# Patient Record
Sex: Male | Born: 1970 | Hispanic: No | Marital: Married | Smoking: Never smoker
Health system: Southern US, Community
[De-identification: ages and names within clinical notes are randomized; demographics above are authoritative.]

## PROBLEM LIST (undated history)

## (undated) DIAGNOSIS — I10 Essential (primary) hypertension: Secondary | ICD-10-CM

---

## 2008-08-09 ENCOUNTER — Ambulatory Visit: Payer: Self-pay | Admitting: Diagnostic Radiology

## 2008-08-09 ENCOUNTER — Emergency Department (HOSPITAL_BASED_OUTPATIENT_CLINIC_OR_DEPARTMENT_OTHER): Admission: EM | Admit: 2008-08-09 | Discharge: 2008-08-09 | Payer: Self-pay | Admitting: Emergency Medicine

## 2010-08-19 LAB — POCT TOXICOLOGY PANEL

## 2010-08-19 LAB — URINALYSIS, ROUTINE W REFLEX MICROSCOPIC
Bilirubin Urine: NEGATIVE
Glucose, UA: 100 mg/dL — AB
Hgb urine dipstick: NEGATIVE
Specific Gravity, Urine: 1.021 (ref 1.005–1.030)
pH: 6.5 (ref 5.0–8.0)

## 2010-08-19 LAB — COMPREHENSIVE METABOLIC PANEL
ALT: 53 U/L (ref 0–53)
BUN: 17 mg/dL (ref 6–23)
CO2: 23 mEq/L (ref 19–32)
Calcium: 8.4 mg/dL (ref 8.4–10.5)
GFR calc non Af Amer: >60 mL/min (ref >60)
Glucose, Bld: 170 mg/dL — ABNORMAL HIGH (ref 70–99)
Sodium: 146 mEq/L — ABNORMAL HIGH (ref 135–145)

## 2010-08-19 LAB — CBC
HCT: 47.3 % (ref 39.0–52.0)
Hemoglobin: 15.9 g/dL (ref 13.0–17.0)
MCHC: 33.6 g/dL (ref 30.0–36.0)
MCV: 84.9 fL (ref 78.0–100.0)
RBC: 5.57 MIL/uL (ref 4.22–5.81)
RDW: 12.4 % (ref 11.5–15.5)

## 2010-08-19 LAB — DIFFERENTIAL
Basophils Absolute: 0.1 10*3/uL (ref 0.0–0.1)
Basophils Relative: 1 % (ref 0–1)
Eosinophils Absolute: 0.2 10*3/uL (ref 0.0–0.7)
Eosinophils Relative: 2 % (ref 0–5)
Lymphs Abs: 4.9 10*3/uL — ABNORMAL HIGH (ref 0.7–4.0)
Monocytes Relative: 6 % (ref 3–12)
Neutro Abs: 5.2 10*3/uL (ref 1.7–7.7)
Neutrophils Relative %: 47 % (ref 43–77)

## 2010-08-19 LAB — URINE CULTURE
Colony Count: NO GROWTH
Culture: NO GROWTH

## 2010-08-19 LAB — MAGNESIUM: Magnesium: 1.9 mg/dL (ref 1.5–2.5)

## 2010-08-19 LAB — URINE MICROSCOPIC-ADD ON

## 2010-08-19 LAB — PROTIME-INR
INR: 1 (ref 0.00–1.49)
Prothrombin Time: 13.1 seconds (ref 11.6–15.2)

## 2010-08-19 LAB — AMMONIA: Ammonia: 23 umol/L (ref 11–35)

## 2010-08-19 LAB — ETHANOL: Alcohol, Ethyl (B): 262 mg/dL — ABNORMAL HIGH (ref 0–10)

## 2012-01-14 ENCOUNTER — Emergency Department (HOSPITAL_BASED_OUTPATIENT_CLINIC_OR_DEPARTMENT_OTHER)
Admission: EM | Admit: 2012-01-14 | Discharge: 2012-01-15 | Disposition: A | Discharge status: Home / Self Care | Payer: Self-pay | Attending: Emergency Medicine | Admitting: Emergency Medicine

## 2012-01-14 ENCOUNTER — Encounter (HOSPITAL_BASED_OUTPATIENT_CLINIC_OR_DEPARTMENT_OTHER): Payer: Self-pay | Admitting: *Deleted

## 2012-01-14 DIAGNOSIS — R739 Hyperglycemia, unspecified: Secondary | ICD-10-CM

## 2012-01-14 DIAGNOSIS — E119 Type 2 diabetes mellitus without complications: Secondary | ICD-10-CM | POA: Insufficient documentation

## 2012-01-14 DIAGNOSIS — I10 Essential (primary) hypertension: Secondary | ICD-10-CM | POA: Insufficient documentation

## 2012-01-14 HISTORY — DX: Essential (primary) hypertension: I10

## 2012-01-14 LAB — GLUCOSE, CAPILLARY: Glucose-Capillary: 216 mg/dL — ABNORMAL HIGH (ref 70–99)

## 2012-01-14 MED ORDER — SODIUM CHLORIDE 0.9 % IV BOLUS (SEPSIS)
1000.0000 mL | Freq: Once | INTRAVENOUS | Status: AC
  Administered 2012-01-15: 1000 mL via INTRAVENOUS

## 2012-01-14 NOTE — ED Provider Notes (Signed)
History     CSN: [id number]  Arrival date & time 01/14/12  2024   First MD Initiated Contact with Patient 01/14/12 2340      Chief Complaint  Patient presents with  . Hyperglycemia    (Consider location/radiation/quality/duration/timing/severity/associated sxs/prior treatment) HPI This is a 41 year old [place] male who has been out of his metformin for 3 days. He is here for a headache that started this afternoon. The onset was gradual. The pain is moderate. He also feels some general malaise. He has had increased urination and thirst. He also complains of moderate to severe burning in his feet which she has had for 5 years. He is also had some scotomata in his lateral right visual field for the last 2 months. He denies nausea, vomiting or diarrhea. He is currently in a rehabilitation program following a DUI. He is not on an antihypertensive.  Past Medical History  Diagnosis Date  . Hypertension   . Diabetes mellitus     History reviewed. No pertinent past surgical history.  History reviewed. No pertinent family history.  History  Substance Use Topics  . Smoking status: Never Smoker   . Smokeless tobacco: Not on file  . Alcohol Use: No      Review of Systems  All other systems reviewed and are negative.    Allergies  Tylenol  Home Medications   Current Outpatient Rx  Name Route Sig Dispense Refill  . METFORMIN HCL 500 MG PO TABS Oral Take 500 mg by mouth 2 (two) times daily with a meal.      BP 175/102  Pulse 83  Temp 99 F (37.2 C) (Oral)  Resp 16  Ht 5\' 9"  (1.753 m)  Wt 180 lb (81.647 kg)  BMI 26.58 kg/m2  SpO2 100%  Physical Exam General: Well-developed, well-nourished male in no acute distress; appearance consistent with age of record HENT: normocephalic, atraumatic Eyes: pupils equal round and reactive to light; extraocular muscles intact Neck: supple Heart: regular rate and rhythm Lungs: clear to auscultation bilaterally Abdomen: soft;  nondistended; nontender; bowel sounds present Extremities: No deformity; full range of motion; pulses normal; no edema Neurologic: Awake, alert and oriented; motor function intact in all extremities and symmetric; no facial droop Skin: Warm and dry Psychiatric: Normal mood and affect    ED Course  Procedures (including critical care time)     MDM   Nursing notes and vitals signs, including pulse oximetry, reviewed.  Summary of this visit's results, reviewed by myself:  Labs:  Results for orders placed during the hospital encounter of 01/14/12  GLUCOSE, CAPILLARY      Component Value Range   Glucose-Capillary 216 (*) 70 - 99 mg/dL  COMPREHENSIVE METABOLIC PANEL      Component Value Range   Sodium 137  135 - 145 mEq/L   Potassium 4.1  3.5 - 5.1 mEq/L   Chloride 100  96 - 112 mEq/L   CO2 28  19 - 32 mEq/L   Glucose, Bld 152 (*) 70 - 99 mg/dL   BUN 11  6 - 23 mg/dL   Creatinine, Ser 4.09  0.50 - 1.35 mg/dL   Calcium 9.2  8.4 - 81.1 mg/dL   Total Protein 6.9  6.0 - 8.3 g/dL   Albumin 3.5  3.5 - 5.2 g/dL   AST 21  0 - 37 U/L   ALT 23  0 - 53 U/L   Alkaline Phosphatase 83  39 - 117 U/L   Total Bilirubin 0.2 (*)  0.3 - 1.2 mg/dL   GFR calc non Af Amer >90  >90 mL/min   GFR calc Af Amer >90  >90 mL/min  CBC WITH DIFFERENTIAL      Component Value Range   WBC 8.5  4.0 - 10.5 K/uL   RBC 4.31  4.22 - 5.81 MIL/uL   Hemoglobin 13.6  13.0 - 17.0 g/dL   HCT 45.4  09.8 - 11.9 %   MCV 90.5  78.0 - 100.0 fL   MCH 31.6  26.0 - 34.0 pg   MCHC 34.9  30.0 - 36.0 g/dL   RDW 14.7  82.9 - 56.2 %   Platelets 322  150 - 400 K/uL   Neutrophils Relative 48  43 - 77 %   Neutro Abs 4.1  1.7 - 7.7 K/uL   Lymphocytes Relative 41  12 - 46 %   Lymphs Abs 3.5  0.7 - 4.0 K/uL   Monocytes Relative 9  3 - 12 %   Monocytes Absolute 0.8  0.1 - 1.0 K/uL   Eosinophils Relative 1  0 - 5 %   Eosinophils Absolute 0.1  0.0 - 0.7 K/uL   Basophils Relative 1  0 - 1 %   Basophils Absolute 0.0  0.0 - 0.1  K/uL           Hanley Seamen, MD 01/15/12 [phone]

## 2012-01-14 NOTE — ED Notes (Signed)
Pt c/o increased bs, pt is pt at [facility] rehab

## 2012-01-14 NOTE — ED Notes (Signed)
BS 216

## 2012-01-15 LAB — CBC WITH DIFFERENTIAL/PLATELET
Eosinophils Relative: 1 % (ref 0–5)
HCT: 39 % (ref 39.0–52.0)
Hemoglobin: 13.6 g/dL (ref 13.0–17.0)
Lymphocytes Relative: 41 % (ref 12–46)
Lymphs Abs: 3.5 10*3/uL (ref 0.7–4.0)
MCV: 90.5 fL (ref 78.0–100.0)
Monocytes Absolute: 0.8 10*3/uL (ref 0.1–1.0)
Monocytes Relative: 9 % (ref 3–12)
Platelets: 322 10*3/uL (ref 150–400)
RBC: 4.31 MIL/uL (ref 4.22–5.81)
WBC: 8.5 10*3/uL (ref 4.0–10.5)

## 2012-01-15 LAB — COMPREHENSIVE METABOLIC PANEL
Albumin: 3.5 g/dL (ref 3.5–5.2)
BUN: 11 mg/dL (ref 6–23)
Creatinine, Ser: 0.5 mg/dL (ref 0.50–1.35)
Total Protein: 6.9 g/dL (ref 6.0–8.3)

## 2012-01-15 MED ORDER — HYDROCHLOROTHIAZIDE 25 MG PO TABS: 25.0000 mg | ORAL_TABLET | Freq: Every day | ORAL | Status: AC

## 2012-01-15 MED ORDER — METFORMIN HCL 500 MG PO TABS
500.0000 mg | ORAL_TABLET | Freq: Once | ORAL | Status: AC
  Administered 2012-01-15: 500 mg via ORAL
  Filled 2012-01-15: qty 1

## 2012-01-15 MED ORDER — METFORMIN HCL 500 MG PO TABS: 500.0000 mg | ORAL_TABLET | Freq: Two times a day (BID) | ORAL | Status: AC

## 2019-09-06 ENCOUNTER — Inpatient Hospital Stay (HOSPITAL_COMMUNITY)
Admission: EM | Admit: 2019-09-06 | Discharge: 2019-10-09 | DRG: 082 | Disposition: E | Payer: Medicaid Other | Attending: Physician Assistant | Admitting: Physician Assistant

## 2019-09-06 ENCOUNTER — Emergency Department (HOSPITAL_COMMUNITY): Payer: Medicaid Other

## 2019-09-06 ENCOUNTER — Other Ambulatory Visit: Payer: Self-pay

## 2019-09-06 ENCOUNTER — Inpatient Hospital Stay (HOSPITAL_COMMUNITY): Payer: Medicaid Other

## 2019-09-06 DIAGNOSIS — E119 Type 2 diabetes mellitus without complications: Secondary | ICD-10-CM | POA: Diagnosis present

## 2019-09-06 DIAGNOSIS — R402112 Coma scale, eyes open, never, at arrival to emergency department: Secondary | ICD-10-CM | POA: Diagnosis present

## 2019-09-06 DIAGNOSIS — R7401 Elevation of levels of liver transaminase levels: Secondary | ICD-10-CM | POA: Diagnosis present

## 2019-09-06 DIAGNOSIS — Z515 Encounter for palliative care: Secondary | ICD-10-CM | POA: Diagnosis present

## 2019-09-06 DIAGNOSIS — S02632A Fracture of coronoid process of left mandible, initial encounter for closed fracture: Secondary | ICD-10-CM | POA: Diagnosis present

## 2019-09-06 DIAGNOSIS — R4020 Unspecified coma: Secondary | ICD-10-CM | POA: Diagnosis present

## 2019-09-06 DIAGNOSIS — S069X9A Unspecified intracranial injury with loss of consciousness of unspecified duration, initial encounter: Secondary | ICD-10-CM | POA: Diagnosis present

## 2019-09-06 DIAGNOSIS — Z7189 Other specified counseling: Secondary | ICD-10-CM

## 2019-09-06 DIAGNOSIS — G911 Obstructive hydrocephalus: Secondary | ICD-10-CM | POA: Diagnosis present

## 2019-09-06 DIAGNOSIS — R402312 Coma scale, best motor response, none, at arrival to emergency department: Secondary | ICD-10-CM | POA: Diagnosis present

## 2019-09-06 DIAGNOSIS — S0232XA Fracture of orbital floor, left side, initial encounter for closed fracture: Secondary | ICD-10-CM | POA: Diagnosis present

## 2019-09-06 DIAGNOSIS — M6282 Rhabdomyolysis: Secondary | ICD-10-CM | POA: Diagnosis present

## 2019-09-06 DIAGNOSIS — G935 Compression of brain: Secondary | ICD-10-CM | POA: Diagnosis present

## 2019-09-06 DIAGNOSIS — F1020 Alcohol dependence, uncomplicated: Secondary | ICD-10-CM | POA: Diagnosis present

## 2019-09-06 DIAGNOSIS — I10 Essential (primary) hypertension: Secondary | ICD-10-CM | POA: Diagnosis present

## 2019-09-06 DIAGNOSIS — G9382 Brain death: Secondary | ICD-10-CM | POA: Diagnosis present

## 2019-09-06 DIAGNOSIS — R4182 Altered mental status, unspecified: Secondary | ICD-10-CM

## 2019-09-06 DIAGNOSIS — R34 Anuria and oliguria: Secondary | ICD-10-CM | POA: Diagnosis not present

## 2019-09-06 DIAGNOSIS — J9601 Acute respiratory failure with hypoxia: Secondary | ICD-10-CM | POA: Diagnosis present

## 2019-09-06 DIAGNOSIS — E8809 Other disorders of plasma-protein metabolism, not elsewhere classified: Secondary | ICD-10-CM | POA: Diagnosis present

## 2019-09-06 DIAGNOSIS — S0240FA Zygomatic fracture, left side, initial encounter for closed fracture: Secondary | ICD-10-CM | POA: Diagnosis present

## 2019-09-06 DIAGNOSIS — Z20822 Contact with and (suspected) exposure to covid-19: Secondary | ICD-10-CM | POA: Diagnosis present

## 2019-09-06 DIAGNOSIS — S0240DA Maxillary fracture, left side, initial encounter for closed fracture: Secondary | ICD-10-CM | POA: Diagnosis present

## 2019-09-06 DIAGNOSIS — Z66 Do not resuscitate: Secondary | ICD-10-CM | POA: Diagnosis not present

## 2019-09-06 DIAGNOSIS — Z7984 Long term (current) use of oral hypoglycemic drugs: Secondary | ICD-10-CM

## 2019-09-06 DIAGNOSIS — X58XXXA Exposure to other specified factors, initial encounter: Secondary | ICD-10-CM | POA: Diagnosis present

## 2019-09-06 DIAGNOSIS — S022XXA Fracture of nasal bones, initial encounter for closed fracture: Secondary | ICD-10-CM | POA: Diagnosis present

## 2019-09-06 DIAGNOSIS — Z79899 Other long term (current) drug therapy: Secondary | ICD-10-CM

## 2019-09-06 DIAGNOSIS — K5900 Constipation, unspecified: Secondary | ICD-10-CM | POA: Diagnosis present

## 2019-09-06 DIAGNOSIS — R402212 Coma scale, best verbal response, none, at arrival to emergency department: Secondary | ICD-10-CM | POA: Diagnosis present

## 2019-09-06 DIAGNOSIS — Z590 Homelessness: Secondary | ICD-10-CM

## 2019-09-06 DIAGNOSIS — S069XAA Unspecified intracranial injury with loss of consciousness status unknown, initial encounter: Secondary | ICD-10-CM | POA: Diagnosis present

## 2019-09-06 LAB — MAGNESIUM: Magnesium: 1.3 mg/dL — ABNORMAL LOW (ref 1.7–2.4)

## 2019-09-06 LAB — COMPREHENSIVE METABOLIC PANEL
ALT: 65 U/L — ABNORMAL HIGH (ref 0–44)
AST: 161 U/L — ABNORMAL HIGH (ref 15–41)
Albumin: 3.1 g/dL — ABNORMAL LOW (ref 3.5–5.0)
Alkaline Phosphatase: 262 U/L — ABNORMAL HIGH (ref 38–126)
Anion gap: 16 — ABNORMAL HIGH (ref 5–15)
BUN: 8 mg/dL (ref 6–20)
CO2: 24 mmol/L (ref 22–32)
Calcium: 7.8 mg/dL — ABNORMAL LOW (ref 8.9–10.3)
Chloride: 101 mmol/L (ref 98–111)
Creatinine, Ser: 0.77 mg/dL (ref 0.61–1.24)
GFR calc Af Amer: >60 mL/min (ref >60)
GFR calc non Af Amer: >60 mL/min (ref >60)
Glucose, Bld: 164 mg/dL — ABNORMAL HIGH (ref 70–99)
Potassium: 3.6 mmol/L (ref 3.5–5.1)
Sodium: 141 mmol/L (ref 135–145)
Total Bilirubin: 1.3 mg/dL — ABNORMAL HIGH (ref 0.3–1.2)
Total Protein: 6.9 g/dL (ref 6.5–8.1)

## 2019-09-06 LAB — CBC WITH DIFFERENTIAL/PLATELET
Abs Immature Granulocytes: 0.04 10*3/uL (ref 0.00–0.07)
Basophils Absolute: 0 10*3/uL (ref 0.0–0.1)
Basophils Relative: 0 %
Eosinophils Absolute: 0 10*3/uL (ref 0.0–0.5)
Eosinophils Relative: 0 %
HCT: 46.2 % (ref 39.0–52.0)
Hemoglobin: 15.6 g/dL (ref 13.0–17.0)
Immature Granulocytes: 0 %
Lymphocytes Relative: 3 %
Lymphs Abs: 0.2 10*3/uL — ABNORMAL LOW (ref 0.7–4.0)
MCH: 31.2 pg (ref 26.0–34.0)
MCHC: 33.8 g/dL (ref 30.0–36.0)
MCV: 92.4 fL (ref 80.0–100.0)
Monocytes Absolute: 0.9 10*3/uL (ref 0.1–1.0)
Monocytes Relative: 9 %
Neutro Abs: 8.4 10*3/uL — ABNORMAL HIGH (ref 1.7–7.7)
Neutrophils Relative %: 88 %
Platelets: 159 10*3/uL (ref 150–400)
RBC: 5 MIL/uL (ref 4.22–5.81)
RDW: 14 % (ref 11.5–15.5)
WBC: 9.5 10*3/uL (ref 4.0–10.5)
nRBC: 0 % (ref 0.0–0.2)

## 2019-09-06 LAB — POCT I-STAT 7, (LYTES, BLD GAS, ICA,H+H)
Acid-Base Excess: 1 mmol/L (ref 0.0–2.0)
Bicarbonate: 26.5 mmol/L (ref 20.0–28.0)
Calcium, Ion: 1.06 mmol/L — ABNORMAL LOW (ref 1.15–1.40)
HCT: 42 % (ref 39.0–52.0)
Hemoglobin: 14.3 g/dL (ref 13.0–17.0)
O2 Saturation: 100 %
Patient temperature: 101.2
Potassium: 3.1 mmol/L — ABNORMAL LOW (ref 3.5–5.1)
Sodium: 141 mmol/L (ref 135–145)
TCO2: 28 mmol/L (ref 22–32)
pCO2 arterial: 45.3 mmHg (ref 32.0–48.0)
pH, Arterial: 7.382 (ref 7.350–7.450)
pO2, Arterial: 333 mmHg — ABNORMAL HIGH (ref 83.0–108.0)

## 2019-09-06 LAB — URINALYSIS, ROUTINE W REFLEX MICROSCOPIC
Bacteria, UA: NONE SEEN
Bilirubin Urine: NEGATIVE
Glucose, UA: 50 mg/dL — AB
Ketones, ur: 20 mg/dL — AB
Leukocytes,Ua: NEGATIVE
Nitrite: NEGATIVE
Protein, ur: >=300 mg/dL — AB
Specific Gravity, Urine: 1.017 (ref 1.005–1.030)
pH: 6 (ref 5.0–8.0)

## 2019-09-06 LAB — GRAM STAIN: Gram Stain: NONE SEEN

## 2019-09-06 LAB — PROTIME-INR
INR: 1.1 (ref 0.8–1.2)
Prothrombin Time: 13.3 seconds (ref 11.4–15.2)

## 2019-09-06 LAB — RESPIRATORY PANEL BY RT PCR (FLU A&B, COVID)
Influenza A by PCR: NEGATIVE
Influenza B by PCR: NEGATIVE
SARS Coronavirus 2 by RT PCR: NEGATIVE

## 2019-09-06 LAB — CK: Total CK: 2362 U/L — ABNORMAL HIGH (ref 49–397)

## 2019-09-06 LAB — SALICYLATE LEVEL: Salicylate Lvl: <7 mg/dL — ABNORMAL LOW (ref 7.0–30.0)

## 2019-09-06 LAB — PHOSPHORUS: Phosphorus: 4.4 mg/dL (ref 2.5–4.6)

## 2019-09-06 LAB — CBG MONITORING, ED: Glucose-Capillary: 156 mg/dL — ABNORMAL HIGH (ref 70–99)

## 2019-09-06 LAB — LACTIC ACID, PLASMA
Lactic Acid, Venous: 3.2 mmol/L (ref 0.5–1.9)
Lactic Acid, Venous: 1.9 mmol/L (ref 0.5–1.9)

## 2019-09-06 LAB — ETHANOL: Alcohol, Ethyl (B): 27 mg/dL — ABNORMAL HIGH

## 2019-09-06 LAB — ACETAMINOPHEN LEVEL: Acetaminophen (Tylenol), Serum: <10 ug/mL — ABNORMAL LOW (ref 10–30)

## 2019-09-06 MED ORDER — PANTOPRAZOLE SODIUM 40 MG IV SOLR
40.0000 mg | Freq: Every day | INTRAVENOUS | Status: DC
  Administered 2019-09-06 – 2019-09-08 (×3): 40 mg via INTRAVENOUS
  Filled 2019-09-06 (×3): qty 40

## 2019-09-06 MED ORDER — PROPOFOL 1000 MG/100ML IV EMUL
5.0000 ug/kg/min | INTRAVENOUS | Status: DC
  Administered 2019-09-06: 15 ug/kg/min via INTRAVENOUS
  Filled 2019-09-06: qty 100

## 2019-09-06 MED ORDER — ALBUTEROL SULFATE (2.5 MG/3ML) 0.083% IN NEBU
10.0000 mg | INHALATION_SOLUTION | Freq: Once | RESPIRATORY_TRACT | Status: AC
  Administered 2019-09-06: 10 mg via RESPIRATORY_TRACT
  Filled 2019-09-06: qty 12

## 2019-09-06 MED ORDER — METOPROLOL TARTRATE 5 MG/5ML IV SOLN
5.0000 mg | Freq: Four times a day (QID) | INTRAVENOUS | Status: DC | PRN
  Administered 2019-09-07 (×2): 5 mg via INTRAVENOUS
  Filled 2019-09-06 (×2): qty 5

## 2019-09-06 MED ORDER — ACETAMINOPHEN 160 MG/5ML PO SOLN
650.0000 mg | Freq: Four times a day (QID) | ORAL | Status: DC | PRN
  Administered 2019-09-07 – 2019-09-08 (×4): 650 mg
  Filled 2019-09-06 (×4): qty 20.3

## 2019-09-06 MED ORDER — CALCIUM GLUCONATE-NACL 1-0.675 GM/50ML-% IV SOLN
1.0000 g | Freq: Once | INTRAVENOUS | Status: AC
  Administered 2019-09-06: 1000 mg via INTRAVENOUS
  Filled 2019-09-06: qty 50

## 2019-09-06 MED ORDER — LEVETIRACETAM IN NACL 500 MG/100ML IV SOLN
500.0000 mg | Freq: Once | INTRAVENOUS | Status: AC
  Administered 2019-09-06: 18:00:00 500 mg via INTRAVENOUS
  Filled 2019-09-06: qty 100

## 2019-09-06 MED ORDER — MAGNESIUM SULFATE 2 GM/50ML IV SOLN
2.0000 g | Freq: Once | INTRAVENOUS | Status: AC
  Administered 2019-09-06: 2 g via INTRAVENOUS
  Filled 2019-09-06: qty 50

## 2019-09-06 MED ORDER — FENTANYL CITRATE (PF) 100 MCG/2ML IJ SOLN
50.0000 ug | INTRAMUSCULAR | Status: DC | PRN
  Administered 2019-09-07 – 2019-09-08 (×5): 100 ug via INTRAVENOUS
  Filled 2019-09-06 (×5): qty 2

## 2019-09-06 MED ORDER — DOCUSATE SODIUM 50 MG/5ML PO LIQD
100.0000 mg | Freq: Two times a day (BID) | ORAL | Status: DC
  Filled 2019-09-06: qty 10

## 2019-09-06 MED ORDER — INSULIN ASPART 100 UNIT/ML ~~LOC~~ SOLN
0.0000 [IU] | SUBCUTANEOUS | Status: DC
  Administered 2019-09-06 – 2019-09-07 (×2): 3 [IU] via SUBCUTANEOUS
  Administered 2019-09-07 – 2019-09-08 (×4): 2 [IU] via SUBCUTANEOUS

## 2019-09-06 MED ORDER — CALCIUM GLUCONATE 10 % IV SOLN
1.0000 g | Freq: Once | INTRAVENOUS | Status: DC
  Filled 2019-09-06: qty 10

## 2019-09-06 MED ORDER — INSULIN ASPART 100 UNIT/ML IV SOLN
10.0000 [IU] | Freq: Once | INTRAVENOUS | Status: AC
  Administered 2019-09-06: 10 [IU] via INTRAVENOUS

## 2019-09-06 MED ORDER — LIDOCAINE HCL (PF) 1 % IJ SOLN
70.0000 mg | Freq: Once | INTRAMUSCULAR | Status: AC
  Administered 2019-09-06: 70 mg
  Filled 2019-09-06: qty 10

## 2019-09-06 MED ORDER — SUCCINYLCHOLINE CHLORIDE 20 MG/ML IJ SOLN
100.0000 mg | Freq: Once | INTRAMUSCULAR | Status: AC
  Administered 2019-09-06: 100 mg via INTRAVENOUS
  Filled 2019-09-06: qty 5

## 2019-09-06 MED ORDER — ACETAMINOPHEN 500 MG PO TABS: 1000.0000 mg | ORAL_TABLET | Freq: Once | ORAL | Status: DC

## 2019-09-06 MED ORDER — ACETAMINOPHEN 500 MG PO TABS
1000.0000 mg | ORAL_TABLET | Freq: Once | ORAL | Status: AC
  Administered 2019-09-06: 1000 mg
  Filled 2019-09-06: qty 2

## 2019-09-06 MED ORDER — LACTATED RINGERS IV BOLUS
1000.0000 mL | Freq: Once | INTRAVENOUS | Status: AC
  Administered 2019-09-06: 1000 mL via INTRAVENOUS

## 2019-09-06 MED ORDER — POLYETHYLENE GLYCOL 3350 17 G PO PACK: 17.0000 g | PACK | Freq: Every day | ORAL | Status: DC

## 2019-09-06 MED ORDER — FENTANYL CITRATE (PF) 100 MCG/2ML IJ SOLN
INTRAMUSCULAR | Status: AC
  Administered 2019-09-06: 100 ug via INTRAVENOUS
  Filled 2019-09-06: qty 2

## 2019-09-06 MED ORDER — PROPOFOL 1000 MG/100ML IV EMUL
5.0000 ug/kg/min | INTRAVENOUS | Status: DC
  Administered 2019-09-06: 5 ug/kg/min via INTRAVENOUS

## 2019-09-06 MED ORDER — DEXTROSE 50 % IV SOLN
1.0000 | Freq: Once | INTRAVENOUS | Status: AC
  Administered 2019-09-06: 18:00:00 50 mL via INTRAVENOUS
  Filled 2019-09-06: qty 50

## 2019-09-06 MED ORDER — PROPOFOL 1000 MG/100ML IV EMUL
0.0000 ug/kg/min | INTRAVENOUS | Status: DC
  Administered 2019-09-06: 23:00:00 15 ug/kg/min via INTRAVENOUS

## 2019-09-06 MED ORDER — ETOMIDATE 2 MG/ML IV SOLN
20.0000 mg | Freq: Once | INTRAVENOUS | Status: AC
  Administered 2019-09-06: 17:00:00 20 mg via INTRAVENOUS
  Filled 2019-09-06: qty 10

## 2019-09-06 MED ORDER — ROCURONIUM BROMIDE 50 MG/5ML IV SOLN: 60.0000 mg | Freq: Once | INTRAVENOUS | Status: DC

## 2019-09-06 MED ORDER — PROPOFOL 1000 MG/100ML IV EMUL
INTRAVENOUS | Status: AC
  Administered 2019-09-06: 15 ug/kg/min via INTRAVENOUS
  Filled 2019-09-06: qty 100

## 2019-09-06 MED ORDER — NIMODIPINE 30 MG PO CAPS: 60.0000 mg | ORAL_CAPSULE | ORAL | Status: DC

## 2019-09-06 MED ORDER — FENTANYL CITRATE (PF) 100 MCG/2ML IJ SOLN: 50.0000 ug | INTRAMUSCULAR | Status: DC | PRN

## 2019-09-06 MED ORDER — FENTANYL CITRATE (PF) 100 MCG/2ML IJ SOLN
140.0000 ug | Freq: Once | INTRAMUSCULAR | Status: AC
  Administered 2019-09-06: 17:00:00 140 ug via INTRAVENOUS
  Filled 2019-09-06: qty 4

## 2019-09-06 MED ORDER — LABETALOL HCL 5 MG/ML IV SOLN
10.0000 mg | Freq: Once | INTRAVENOUS | Status: AC
  Administered 2019-09-06: 16:00:00 10 mg via INTRAVENOUS
  Filled 2019-09-06: qty 4

## 2019-09-06 MED ORDER — POTASSIUM CHLORIDE IN NACL 20-0.9 MEQ/L-% IV SOLN
INTRAVENOUS | Status: DC
  Filled 2019-09-06 (×3): qty 1000

## 2019-09-06 MED ORDER — PANTOPRAZOLE SODIUM 40 MG PO TBEC: 40.0000 mg | DELAYED_RELEASE_TABLET | Freq: Every day | ORAL | Status: DC

## 2019-09-06 MED ORDER — FENTANYL CITRATE (PF) 100 MCG/2ML IJ SOLN
100.0000 ug | Freq: Once | INTRAMUSCULAR | Status: AC
  Administered 2019-09-06: 17:00:00 100 ug via INTRAVENOUS
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
## 2019-09-06 NOTE — ED Notes (Signed)
Head CT done.

## 2019-09-06 NOTE — ED Provider Notes (Signed)
Kirk Schmidt Note   CSN: [id number] Arrival date & time: 08/11/2019  1530     History Chief Complaint  Patient presents with  . Altered Mental Status    Kirk Schmidt is a 49 y.o. male.  The history is provided by the patient and the EMS personnel. The history is limited by the condition of the patient.      49 year old mlae with a pmh of alcohol abuse, DM, HTN, homelessness, presenting to the ED brought in EMS after being found face down in his tent unresponsive with dried blood to the bilateral nares and mouth. He was noted to have unequal pupils. Unknown down time. Normal sugar pta. History limited 2/2 patient's condition.   Past Medical History:  Diagnosis Date  . Diabetes mellitus   . Hypertension     Patient Active Problem List   Diagnosis Date Noted  . TBI (traumatic brain injury) ([place]) 09/01/2019    No past surgical history on file.     No family history on file.  Social History   Tobacco Use  . Smoking status: Never Smoker  Substance Use Topics  . Alcohol use: No  . Drug use: No    Home Medications Prior to Admission medications   Medication Sig Start Date End Date Taking? Authorizing Schmidt  hydrochlorothiazide (HYDRODIURIL) 25 MG tablet Take 1 tablet (25 mg total) by mouth daily. 01/15/12 01/14/13  Molpus, Jenny Reichmann, MD  metFORMIN (GLUCOPHAGE) 500 MG tablet Take 1 tablet (500 mg total) by mouth 2 (two) times daily with a meal. 01/15/12   Molpus, John, MD    Allergies    Tylenol [acetaminophen]  Review of Systems   Review of Systems  Unable to perform ROS: Acuity of condition    Physical Exam Updated Vital Signs BP (!) 143/80   Pulse 85   Temp (!) 101.3 F (38.5 C)   Resp (!) 21   Ht _0  (1.651 m)   Wt 70.1 kg   SpO2 99%   BMI 25.72 kg/m   Physical Exam Constitutional:      General: He is in acute distress.     Appearance: He is ill-appearing.  Eyes:     Comments: 5 mm L, 3 mm R  unreactive  Neurological:     Mental Status: He is unresponsive.     GCS: GCS eye subscore is 1. GCS verbal subscore is 1. GCS motor subscore is 1.     ED Results / Procedures / Treatments   Labs (all labs ordered are listed, but only abnormal results are displayed) Labs Reviewed  CBC WITH DIFFERENTIAL/PLATELET - Abnormal; Notable for the following components:      Result Value   Neutro Abs 8.4 (*)    Lymphs Abs 0.2 (*)    All other components within normal limits  ETHANOL - Abnormal; Notable for the following components:   Alcohol, Ethyl (B) 27 (*)    All other components within normal limits  ACETAMINOPHEN LEVEL - Abnormal; Notable for the following components:   Acetaminophen (Tylenol), Serum <10 (*)    All other components within normal limits  SALICYLATE LEVEL - Abnormal; Notable for the following components:   Salicylate Lvl <6.8 (*)    All other components within normal limits  URINALYSIS, ROUTINE W REFLEX MICROSCOPIC - Abnormal; Notable for the following components:   Color, Urine AMBER (*)    Glucose, UA 50 (*)    Hgb urine dipstick LARGE (*)  Ketones, ur 20 (*)    Protein, ur >=300 (*)    All other components within normal limits  LACTIC ACID, PLASMA - Abnormal; Notable for the following components:   Lactic Acid, Venous 3.2 (*)    All other components within normal limits  COMPREHENSIVE METABOLIC PANEL - Abnormal; Notable for the following components:   Glucose, Bld 164 (*)    Calcium 7.8 (*)    Albumin 3.1 (*)    AST 161 (*)    ALT 65 (*)    Alkaline Phosphatase 262 (*)    Total Bilirubin 1.3 (*)    Anion gap 16 (*)    All other components within normal limits  MAGNESIUM - Abnormal; Notable for the following components:   Magnesium 1.3 (*)    All other components within normal limits  CK - Abnormal; Notable for the following components:   Total CK 2,362 (*)    All other components within normal limits  POCT I-STAT EG7 - Abnormal; Notable for the  following components:   pH, Ven 7.438 (*)    pCO2, Ven 37.3 (*)    pO2, Ven 63.0 (*)    Sodium 134 (*)    Potassium >8.5 (*)    Calcium, Ion 0.82 (*)    All other components within normal limits  POCT I-STAT 7, (LYTES, BLD GAS, ICA,H+H) - Abnormal; Notable for the following components:   pO2, Arterial 333 (*)    Potassium 3.1 (*)    Calcium, Ion 1.06 (*)    All other components within normal limits  GRAM STAIN  RESPIRATORY PANEL BY RT PCR (FLU A&B, COVID)  CULTURE, BLOOD (ROUTINE X 2)  CULTURE, BLOOD (ROUTINE X 2)  URINE CULTURE  LACTIC ACID, PLASMA  PROTIME-INR  PHOSPHORUS  BLOOD GAS, ARTERIAL  TRIGLYCERIDES  I-STAT VENOUS BLOOD GAS, ED  I-STAT CHEM 8, ED    EKG EKG Interpretation  Date/Time:  Thursday September 06 2019 15:47:52 EDT Ventricular Rate:  118 PR Interval:    QRS Duration: 98 QT Interval:  327 QTC Calculation: 459 R Axis:   73 Text Interpretation: Sinus tachycardia Biatrial enlargement Left ventricular hypertrophy When compared with ECG of 08/09/2008, No significant change was found Confirmed by Delora Fuel ([id number]) on 08/29/2019 5:50:49 PM   Radiology CT HEAD WO CONTRAST  Result Date: 08/28/2019 CLINICAL DATA:  Cerebral hemorrhage suspected. Additional provided: Patient found down face first in 10 unresponsive. EXAM: CT HEAD WITHOUT CONTRAST TECHNIQUE: Contiguous axial images were obtained from the base of the skull through the vertex without intravenous contrast. COMPARISON:  Noncontrast head CT 03/01/2019 FINDINGS: Brain: There is a large mixed density holohemispheric hyperacute/acute right subdural hematoma measuring 2.2 cm in thickness. There is also thin acute subdural hemorrhage along the falx, and right tentorium. Severe mass effect upon the underlying right cerebral hemisphere. Complete effacement of the lateral and third ventricles. 2 cm leftward midline shift. There is right-sided uncal herniation as well as leftward subfalcine herniation. Mass effect upon  the brainstem. There is a 3 mm focus of head within the left aspect of the pons which may reflect a Duret hemorrhage. 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 There is an additional acute left-sided subdural hemorrhage overlying the left frontoparietal lobes measuring up to 5 mm. There is also scattered small volume acute subarachnoid hemorrhage most notably within the interhemispheric fissure. Vascular: No definite hyperdense vessel. Skull: There is a nondisplaced fracture within the left orbital roof (series 4, image 46). There is  also comminuted displaced fracture lateral wall of the left orbit. There is a mildly displaced fracture within the anterior wall of the left maxillary sinus which extends to involve the left orbital floor. There is additional comminuted and displaced fracturing of the anterior and posterolateral walls of the left maxillary sinus. Redemonstrated comminuted bilateral nasal bone fractures. Mildly displaced fracture of the coronoid process of the left mandible (series 5, image 30). Comminuted and displaced fracture of the left zygomatic arch. Sinuses/Orbits: No definite retrobulbar hematoma. The globes are normal in size and contour. Hyperdense opacification of the left maxillary sinus likely reflecting blood. Left frontal and bilateral ethmoid sinus mucosal thickening. There is a left mastoid effusion. No definite left temporal bone fracture is identified. These results were called by telephone at the time of interpretation on 08/10/2019 at 5:15 pm to [facility] , who verbally acknowledged these results. IMPRESSION: 1. Large mixed density holohemispheric hyperacute/acute right subdural hematoma measuring 2 cm in thickness. Thin acute subdural blood products are also present along the falx and right tentorium. 2. Severe mass effect upon the underlying right cerebral hemisphere. 2 cm leftward midline shift with complete effacement of the  right lateral and third ventricles. Right uncal herniation with mass effect upon the brainstem as well as leftward subfalcine herniation. 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 A 3 mm parenchymal hemorrhage within the upper pons may reflect a Duret hemorrage. 3. Acute subdural hematoma overlying the left frontoparietal lobes measuring 5 mm in greatest thickness. 4. Scattered acute subarachnoid hemorrhage greatest within the interhemispheric fissure. 5. Multiple maxillofacial and left orbital fractures as detailed. Of note, one of the left orbital fractures traverses the left orbital roof. Maxillofacial CT is recommended for further evaluation when clinically feasible. 6. Mildly displaced fracture of the coronoid process of the left mandible. 7. Hyperdense opacification of the left maxillary sinus likely reflecting blood. 8. Left mastoid effusion without definite left temporal bone fracture identified. Electronically Signed   By: Kellie Simmering DO   On: 08/31/2019 17:21   DG Chest Portable 1 View  Result Date: 09/05/2019 CLINICAL DATA:  Tube placement. EXAM: PORTABLE CHEST 1 VIEW COMPARISON:  Chest radiograph earlier this day. FINDINGS: Enteric tube is in place with tip below the diaphragm, not included in the field of view. An enteric tube is tentatively visualized with tip projecting just below the clavicular heads, however this is not well assessed due to overlapping monitoring devices and rotation. Heart is normal in size. Aortic atherosclerosis. No pneumothorax or confluent airspace disease. IMPRESSION: 1. Enteric tube in place with tip below the diaphragm not included in the field of view. 2. Possible endotracheal tube with tip projecting below the clavicular heads, however this is not well assessed given overlapping structures and patient rotation. Recommend follow-up exam limiting patient rotation. Electronically Signed   By: Keith Rake M.D.   On: 09/01/2019 18:28    DG Chest Portable 1 View  Result Date: 08/11/2019 CLINICAL DATA:  Altered mental status. EXAM: PORTABLE CHEST 1 VIEW COMPARISON:  November 23, 2018. FINDINGS: The heart size and mediastinal contours are within normal limits. Both lungs are clear. No pneumothorax or pleural effusion is noted. The visualized skeletal structures are unremarkable. IMPRESSION: No active disease. Electronically Signed   By: Marijo Conception M.D.   On: 08/23/2019 16:52    Procedures Procedure Name: Intubation Date/Time: 09/05/2019 6:06 PM Performed by: Filbert Berthold, MD Pre-anesthesia Checklist: Patient identified, Emergency Drugs available, Suction available, Patient being monitored and Timeout  performed Oxygen Delivery Method: Non-rebreather mask Preoxygenation: Pre-oxygenation with 100% oxygen Induction Type: Rapid sequence Laryngoscope Size: Mac and 3 Grade View: Grade I Tube size: 7.5 mm Number of attempts: 1 Placement Confirmation: ETT inserted through vocal cords under direct vision,  Positive ETCO2,  CO2 detector and Breath sounds checked- equal and bilateral Secured at: 24 cm Tube secured with: ETT holder      (including critical care time)  Medications Ordered in ED Medications  acetaminophen (TYLENOL) tablet 1,000 mg (has no administration in time range)  propofol (DIPRIVAN) 1000 MG/100ML infusion (15 mcg/kg/min  70.1 kg Intravenous New Bag/Given 08/29/2019 1921)  acetaminophen (TYLENOL) tablet 1,000 mg (1,000 mg Oral Not Given 08/24/2019 1847)  magnesium sulfate IVPB 2 g 50 mL (has no administration in time range)  lactated ringers bolus 1,000 mL (has no administration in time range)  labetalol (NORMODYNE) injection 10 mg (10 mg Intravenous Given 08/24/2019 1610)  levETIRAcetam (KEPPRA) IVPB 500 mg/100 mL premix (0 mg Intravenous Stopped 08/14/2019 1805)  lidocaine (PF) (XYLOCAINE) 1 % injection 70 mg (70 mg Other Given 09/07/2019 1646)  fentaNYL (SUBLIMAZE) injection 140 mcg (140 mcg Intravenous Given  08/28/2019 1646)  etomidate (AMIDATE) injection 20 mg (20 mg Intravenous Given 08/17/2019 1656)  succinylcholine (ANECTINE) injection 100 mg (100 mg Intravenous Given 08/26/2019 1657)  albuterol (PROVENTIL) (2.5 MG/3ML) 0.083% nebulizer solution 10 mg (10 mg Nebulization Given 09/02/2019 1722)  insulin aspart (novoLOG) injection 10 Units (10 Units Intravenous Given 08/30/2019 1800)    And  dextrose 50 % solution 50 mL (50 mLs Intravenous Given 08/18/2019 1800)  calcium gluconate 1 g/ 50 mL sodium chloride IVPB (1,000 mg Intravenous New Bag/Given 08/27/2019 1805)  fentaNYL (SUBLIMAZE) injection 100 mcg (100 mcg Intravenous Given 08/09/2019 1727)    ED Course  I have reviewed the triage vital signs and the nursing notes.  Pertinent labs & imaging results that were available during my care of the patient were reviewed by me and considered in my medical decision making (see chart for details).    MDM Rules/Calculators/A&P                      49 year old mlae with a pmh of alcohol abuse, DM, HTN, homelessness, presenting to the ED brought in EMS after being found face down in his tent unresponsive with dried blood to the bilateral nares and mouth.  Initial concerns most for spontaneous versus traumatic intracranial hemorrhage given the patient's initial hypertension treated acutely with labetalol.  Patient separately transported emergently to the CT scanner for imaging which demonstrated an acute large subdural hemorrhage with midline shift.  Patient was subsequently intubated for airway protection given low GCS.  Patient was premedicated with lidocaine and fentanyl and subsequently induced with etomidate and succinylcholine.  Intubation was performed as outlined above.  CT head demonstrated: "1. Large mixed density holohemispheric hyperacute/acute right subdural hematoma measuring 2 cm in thickness. Thin acute subdural blood products are also present along the falx and right tentorium. 2. Severe mass effect upon the  underlying right cerebral hemisphere. 2 cm leftward midline shift with complete effacement of the right lateral and third ventricles. Right uncal herniation with mass effect upon the brainstem as well as leftward subfalcine herniation. 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 A 3 mm parenchymal hemorrhage within the upper pons may reflect a Duret hemorrage. 3. Acute subdural hematoma overlying the left frontoparietal lobes measuring 5 mm in greatest thickness. 4. Scattered acute subarachnoid hemorrhage greatest within the  interhemispheric fissure. 5. Multiple maxillofacial and left orbital fractures as detailed. Of note, one of the left orbital fractures traverses the left orbital roof. Maxillofacial CT is recommended for further evaluation when clinically feasible. 6. Mildly displaced fracture of the coronoid process of the left mandible. 7. Hyperdense opacification of the left maxillary sinus likely reflecting blood. 8. Left mastoid effusion without definite left temporal bone fracture identified."  Emergent consult placed to neurosurgery at 1623 for consideration of operative management and further recommendations in the setting of the severe subdural hematoma with midline shift and mass-effect as well as subfalcine herniation.  ECG interpreted by me demonstrated Sinus tachycardia 180 bpm, normal axis, QTC 459 otherwise normal intervals, biatrial enlargement, LVH, hyperacute T waves in anterior lateral leads, overall similar compared to previous on 08/09/2008  CXR interpreted by me demonstrated no acute cardiopulmonary processes  Chest x-ray post intubation demonstrated ETT in appropriate position, OG tube in appropriate position  Labs demonstrated initial lactic acid 3.2, unremarkable CBC, i-STAT VBG demonstrated a pH of 7.438/37.3/63.0 with mild hyponatremia 134, hyperkalemia to greater than 8.5 and hypocalcemia to 0.82, peaked T waves noted on the  patient's EKG and will temporized with calcium, albuterol and insulin, serum ethanol level 27, negative acetaminophen and salicylate level, Covid assay negative, urinalysis with greater than 300 protein and 20 ketones, repeat lactic acid improved to 1.9, CMP noted to have hypocalcemia to 7.8, hypoalbuminemia 3.1, transaminitis with AST of 161, ALT 65 and alk phos of 262 with a T bili elevation of 1.3 and an anion gap of 16, hypomagnesemia to 1.3 will replete IV, phosphorus 4.4.  CK noted to be 2362.  Will treat with additional IV fluids in setting of possible rhabdomyolysis.  Following neurosurgery's bedside evaluation they felt this is a nonsurvivable injury and had no surgical intervention to offer.  We will admit the patient to trauma surgery for ongoing evaluation monitoring and management as well as coordination care.  The plan for this patient was discussed with Dr. Roxanne Mins, who voiced agreement and who oversaw evaluation and treatment of this patient.  Final Clinical Impression(s) / ED Diagnoses Final diagnoses:  Altered mental status, unspecified altered mental status type    Rx / DC Orders ED Discharge Orders    None      Filbert Berthold, MD 16/07/37 [id number]  Delora Fuel, MD 69/48/54 [id number]

## 2019-09-06 NOTE — ED Triage Notes (Signed)
EMS received a call  From un known person that Pt was found down face first in his tent unresponsive . Pt arrived Via EMS with 100% non rebreather mask on with SPO2 98%. Pt does not respond to verbal name calling. EMS vitals CBG 191 , Temp 103 PO and BP 192/104. Unknown HX and unknown Meds.

## 2019-09-06 NOTE — Progress Notes (Signed)
PT transported to CT and back with no complications 

## 2019-09-07 LAB — CBC
HCT: 41.9 % (ref 39.0–52.0)
Hemoglobin: 14.1 g/dL (ref 13.0–17.0)
MCH: 30.6 pg (ref 26.0–34.0)
MCHC: 33.7 g/dL (ref 30.0–36.0)
MCV: 90.9 fL (ref 80.0–100.0)
Platelets: 101 10*3/uL — ABNORMAL LOW (ref 150–400)
RBC: 4.61 MIL/uL (ref 4.22–5.81)
RDW: 13.8 % (ref 11.5–15.5)
WBC: 9.2 10*3/uL (ref 4.0–10.5)
nRBC: 0 % (ref 0.0–0.2)

## 2019-09-07 LAB — BASIC METABOLIC PANEL
Anion gap: 17 — ABNORMAL HIGH (ref 5–15)
BUN: 9 mg/dL (ref 6–20)
CO2: 25 mmol/L (ref 22–32)
Calcium: 8.3 mg/dL — ABNORMAL LOW (ref 8.9–10.3)
Chloride: 100 mmol/L (ref 98–111)
Creatinine, Ser: 0.66 mg/dL (ref 0.61–1.24)
GFR calc Af Amer: >60 mL/min (ref >60)
GFR calc non Af Amer: >60 mL/min (ref >60)
Glucose, Bld: 140 mg/dL — ABNORMAL HIGH (ref 70–99)
Potassium: 3.7 mmol/L (ref 3.5–5.1)
Sodium: 142 mmol/L (ref 135–145)

## 2019-09-07 LAB — HIV ANTIBODY (ROUTINE TESTING W REFLEX): HIV Screen 4th Generation wRfx: NONREACTIVE

## 2019-09-07 LAB — GLUCOSE, CAPILLARY
Glucose-Capillary: 109 mg/dL — ABNORMAL HIGH (ref 70–99)
Glucose-Capillary: 120 mg/dL — ABNORMAL HIGH (ref 70–99)
Glucose-Capillary: 130 mg/dL — ABNORMAL HIGH (ref 70–99)
Glucose-Capillary: 132 mg/dL — ABNORMAL HIGH (ref 70–99)
Glucose-Capillary: 148 mg/dL — ABNORMAL HIGH (ref 70–99)
Glucose-Capillary: 150 mg/dL — ABNORMAL HIGH (ref 70–99)
Glucose-Capillary: 94 mg/dL (ref 70–99)

## 2019-09-07 LAB — URINE CULTURE: Culture: NO GROWTH

## 2019-09-07 LAB — HEMOGLOBIN A1C
Hgb A1c MFr Bld: 7.5 % — ABNORMAL HIGH (ref 4.8–5.6)
Mean Plasma Glucose: 168.55 mg/dL

## 2019-09-07 LAB — MRSA PCR SCREENING: MRSA by PCR: POSITIVE — AB

## 2019-09-07 LAB — TRIGLYCERIDES: Triglycerides: 154 mg/dL — ABNORMAL HIGH (ref <150)

## 2019-09-07 MED ORDER — POLYETHYLENE GLYCOL 3350 17 G PO PACK
17.0000 g | PACK | Freq: Every day | ORAL | Status: DC
  Administered 2019-09-07 – 2019-09-08 (×2): 17 g
  Filled 2019-09-07 (×2): qty 1

## 2019-09-07 MED ORDER — LEVETIRACETAM IN NACL 500 MG/100ML IV SOLN
500.0000 mg | Freq: Two times a day (BID) | INTRAVENOUS | Status: DC
  Administered 2019-09-07 – 2019-09-08 (×4): 500 mg via INTRAVENOUS
  Filled 2019-09-07 (×4): qty 100

## 2019-09-07 MED ORDER — CHLORHEXIDINE GLUCONATE 0.12% ORAL RINSE (MEDLINE KIT)
15.0000 mL | Freq: Two times a day (BID) | OROMUCOSAL | Status: DC
  Administered 2019-09-07 – 2019-09-08 (×4): 15 mL via OROMUCOSAL

## 2019-09-07 MED ORDER — SODIUM CHLORIDE 0.9 % IV BOLUS
1000.0000 mL | Freq: Once | INTRAVENOUS | Status: AC
  Administered 2019-09-07: 1000 mL via INTRAVENOUS

## 2019-09-07 MED ORDER — MUPIROCIN 2 % EX OINT
TOPICAL_OINTMENT | Freq: Two times a day (BID) | CUTANEOUS | Status: DC
  Administered 2019-09-07 – 2019-09-08 (×2): 1 via NASAL
  Filled 2019-09-07: qty 22

## 2019-09-07 MED ORDER — CHLORHEXIDINE GLUCONATE CLOTH 2 % EX PADS
6.0000 | MEDICATED_PAD | Freq: Every day | CUTANEOUS | Status: DC
  Administered 2019-09-08: 6 via TOPICAL

## 2019-09-07 MED ORDER — ORAL CARE MOUTH RINSE
15.0000 mL | OROMUCOSAL | Status: DC
  Administered 2019-09-07 – 2019-09-08 (×17): 15 mL via OROMUCOSAL

## 2019-09-07 MED ORDER — DOCUSATE SODIUM 50 MG/5ML PO LIQD
100.0000 mg | Freq: Two times a day (BID) | ORAL | Status: DC
  Administered 2019-09-07 – 2019-09-08 (×3): 100 mg
  Filled 2019-09-07 (×3): qty 10

## 2019-09-07 NOTE — Care Management (Addendum)
Unable to reach family as of yet. Pt listed Lavinia Sharps, NP, off [facility] as PCP; called [facility] to see if they have information on this patient, unfortunately the medical clinic is closed today and they are unable to access those records.  It will not reopen until Monday.   Called non-emergent [facility] police; they will send an officer out to address listed in demographics.  Per their records, he was at this address as of 8/20, and had to be involuntarily committed.  Officer to notify me with updates.  Will provide information as available.   Quintella Baton, RN, BSN  Trauma/Neuro ICU Case Manager [phone]  Addendum:  2:37pm  [employer] Police Officer, Lezlie Lye out to listed address:  Melton Krebs there knows patient, but speaks little/no English.  Officer Webb Silversmith able to locate wife's phone # [phone].  I called her; she states that she is his EX- wife, but is able to give me numbers for his brother and sister.  Appreciate [employer] assistance with this situation.    Sister Kandis Fantasia:  [phone]  Brother Gurpal:  [phone]  Quintella Baton, RN, BSN  Trauma/Neuro ICU Case Manager [phone]
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
## 2019-09-08 DIAGNOSIS — Z7189 Other specified counseling: Secondary | ICD-10-CM

## 2019-09-08 DIAGNOSIS — Z515 Encounter for palliative care: Secondary | ICD-10-CM

## 2019-09-08 LAB — TRIGLYCERIDES: Triglycerides: 176 mg/dL — ABNORMAL HIGH (ref <150)

## 2019-09-08 LAB — GLUCOSE, CAPILLARY
Glucose-Capillary: 108 mg/dL — ABNORMAL HIGH (ref 70–99)
Glucose-Capillary: 124 mg/dL — ABNORMAL HIGH (ref 70–99)
Glucose-Capillary: 92 mg/dL (ref 70–99)
Glucose-Capillary: 102 mg/dL — ABNORMAL HIGH (ref 70–99)

## 2019-09-08 MED ORDER — ENOXAPARIN SODIUM 30 MG/0.3ML ~~LOC~~ SOLN: 30.0000 mg | Freq: Two times a day (BID) | SUBCUTANEOUS | Status: DC

## 2019-09-08 MED ORDER — SODIUM CHLORIDE 0.9 % IV SOLN: INTRAVENOUS | Status: DC

## 2019-09-08 MED ORDER — GLYCOPYRROLATE 0.2 MG/ML IJ SOLN
0.4000 mg | INTRAMUSCULAR | Status: DC
  Administered 2019-09-08 – 2019-09-09 (×7): 0.4 mg via INTRAVENOUS
  Filled 2019-09-08 (×6): qty 2

## 2019-09-08 MED ORDER — PANTOPRAZOLE SODIUM 40 MG PO PACK: 40.0000 mg | PACK | Freq: Every day | ORAL | Status: DC

## 2019-09-08 MED ORDER — BISACODYL 10 MG RE SUPP: 10.0000 mg | Freq: Every day | RECTAL | Status: DC | PRN

## 2019-09-08 MED ORDER — ONDANSETRON HCL 4 MG/2ML IJ SOLN: 4.0000 mg | Freq: Four times a day (QID) | INTRAMUSCULAR | Status: DC | PRN

## 2019-09-08 MED ORDER — HYPROMELLOSE (GONIOSCOPIC) 2.5 % OP SOLN
1.0000 [drp] | Freq: Four times a day (QID) | OPHTHALMIC | Status: DC | PRN
  Filled 2019-09-08: qty 15

## 2019-09-08 MED ORDER — ACETAMINOPHEN 650 MG RE SUPP: 650.0000 mg | RECTAL | Status: DC | PRN

## 2019-09-08 MED ORDER — MIDAZOLAM 50MG/50ML (1MG/ML) PREMIX INFUSION: 0.5000 mg/h | INTRAVENOUS | Status: DC

## 2019-09-08 MED ORDER — SODIUM CHLORIDE 0.9 % IV BOLUS
1000.0000 mL | Freq: Once | INTRAVENOUS | Status: AC
  Administered 2019-09-08: 1000 mL via INTRAVENOUS

## 2019-09-08 MED ORDER — LEVETIRACETAM 100 MG/ML PO SOLN: 500.0000 mg | Freq: Two times a day (BID) | ORAL | Status: DC

## 2019-09-08 MED ORDER — FENTANYL 2500MCG IN NS 250ML (10MCG/ML) PREMIX INFUSION
0.0000 ug/h | INTRAVENOUS | Status: DC
  Administered 2019-09-08: 25 ug/h via INTRAVENOUS
  Filled 2019-09-08: qty 250

## 2019-09-08 NOTE — Procedures (Signed)
Extubation Procedure Note  Patient Details:   Name: Kirk Schmidt DOB: 27-Oct-1970 MRN: [id number]   Airway Documentation:    Vent end date: 09/08/19 Vent end time: 1815   Evaluation  O2 sats: currently acceptable Complications: No apparent complications Patient did tolerate procedure well. Bilateral Breath Sounds: Clear, Diminished   No   Pt extubated to comfort care per physician order/family request.   Derinda Late 09/08/2019, 6:16 PM
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
## 2019-09-08 NOTE — Progress Notes (Signed)
Parents are responsible for patient, but do not speak [place]. If needed, please contact with [place] interpreter. Their contact information is as follows:   Theora Gianotti (father) [address] [place], [place] [address] [phone]  Otherwise, you can contact the patient's sister, who is local and speaks English:  Tawny Hopping [address] [place], [place] [address] [phone]   Family requests to be notified at time of death, please contact Bindu for this.  Funeral arrangements not known at this time and family was given card with patient placement's contact information.
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
## 2019-09-08 DEATH — deceased

## 2019-09-09 MED ORDER — LORAZEPAM 2 MG/ML IJ SOLN: 0.5000 mg | INTRAMUSCULAR | Status: DC | PRN

## 2019-09-10 LAB — POCT I-STAT EG7
Acid-Base Excess: 1 mmol/L (ref 0.0–2.0)
Bicarbonate: 25.2 mmol/L (ref 20.0–28.0)
Calcium, Ion: 0.82 mmol/L — CL (ref 1.15–1.40)
HCT: 49 % (ref 39.0–52.0)
Hemoglobin: 16.7 g/dL (ref 13.0–17.0)
O2 Saturation: 92 %
Potassium: >8.5 mmol/L (ref 3.5–5.1)
Sodium: 134 mmol/L — ABNORMAL LOW (ref 135–145)
TCO2: 26 mmol/L (ref 22–32)
pCO2, Ven: 37.3 mmHg — ABNORMAL LOW (ref 44.0–60.0)
pH, Ven: 7.438 — ABNORMAL HIGH (ref 7.250–7.430)
pO2, Ven: 63 mmHg — ABNORMAL HIGH (ref 32.0–45.0)

## 2019-09-11 LAB — CULTURE, BLOOD (ROUTINE X 2)
Culture: NO GROWTH
Culture: NO GROWTH
Special Requests: ADEQUATE
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
## 2019-10-09 NOTE — Progress Notes (Signed)
175cc from 250cc bag of Fentanyl drip wasted in [facility]. Witnessed by [employer].

## 2019-10-09 DEATH — deceased

## 2020-10-05 IMAGING — DX DG CHEST 1V PORT
1 series · 1 of 1 positions shown · non-contrast
Comparison: November 23, 2018.

CLINICAL DATA: Altered mental status.

EXAM:
PORTABLE CHEST 1 VIEW

[chest]
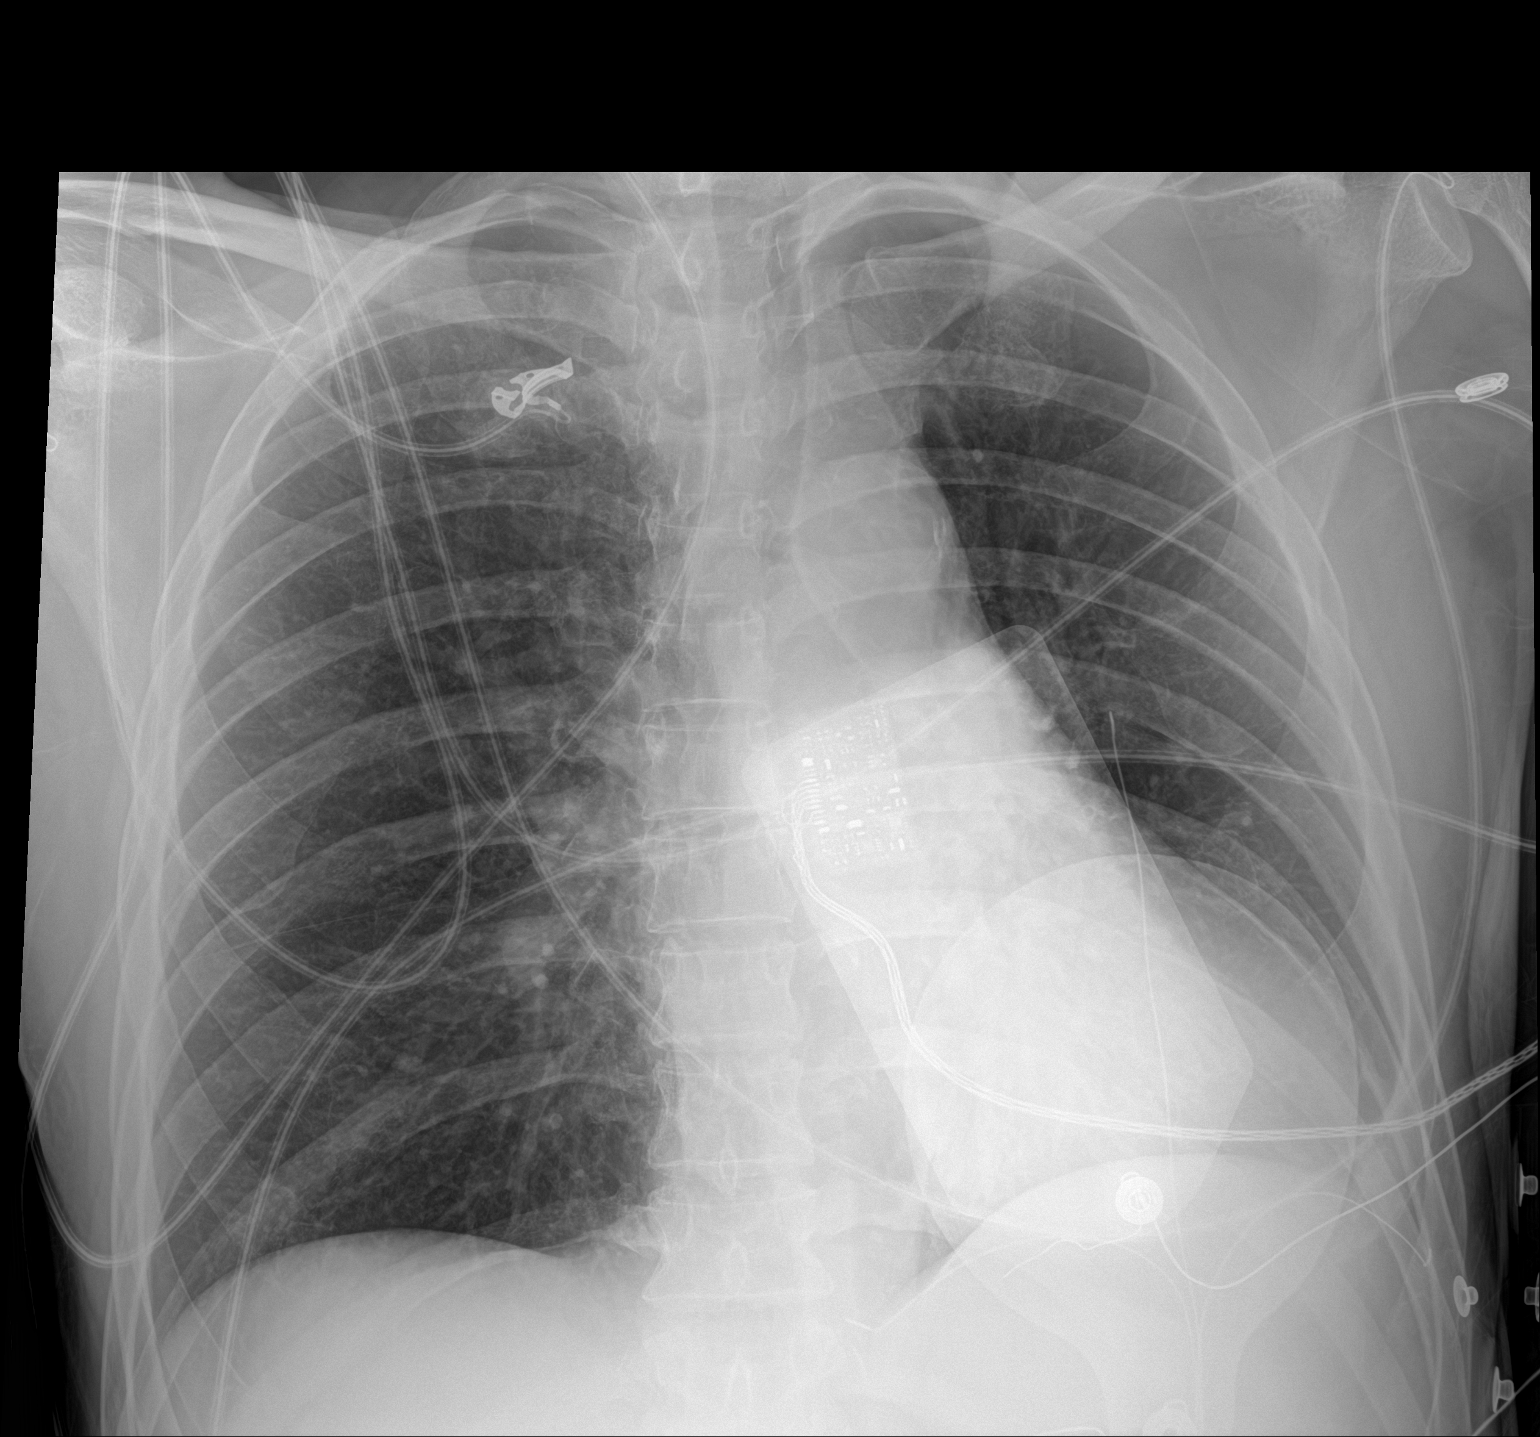

[1 of 1 positions shown; findings below may reference images not displayed]

FINDINGS: The heart size and mediastinal contours are within normal limits.
Both lungs are clear. No pneumothorax or pleural effusion is noted.
The visualized skeletal structures are unremarkable.
IMPRESSION: No active disease.

## 2020-10-05 IMAGING — CT CT HEAD W/O CM
3 of 4 series · 13 of 47 positions shown, 15 images · non-contrast
Comparison: Noncontrast head CT 03/01/2019

CLINICAL DATA: Cerebral hemorrhage suspected. Additional provided:
Patient found down face first in 10 unresponsive.

EXAM:
CT HEAD WITHOUT CONTRAST
TECHNIQUE: Contiguous axial images were obtained from the base of the skull
through the vertex without intravenous contrast.

[Series 3: head wo · axial · 0.46mm/px · z∈[+1200,+1346]mm · 7 of 39 slices shown, 9 images]
[im 5/39  brain]
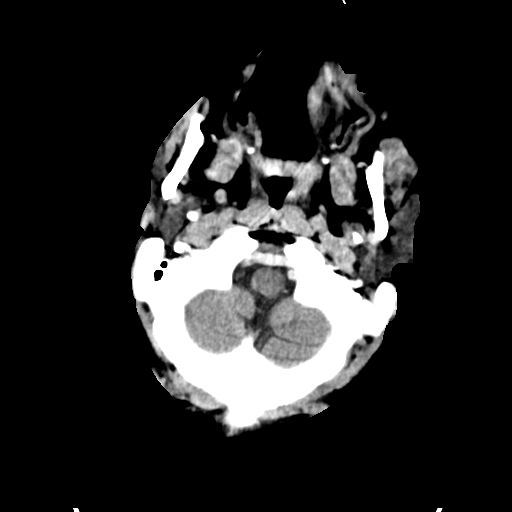
[im 5/39  bone]
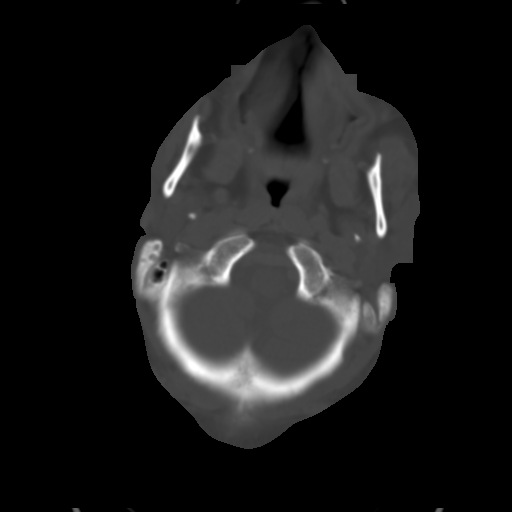
[im 10/39  brain]
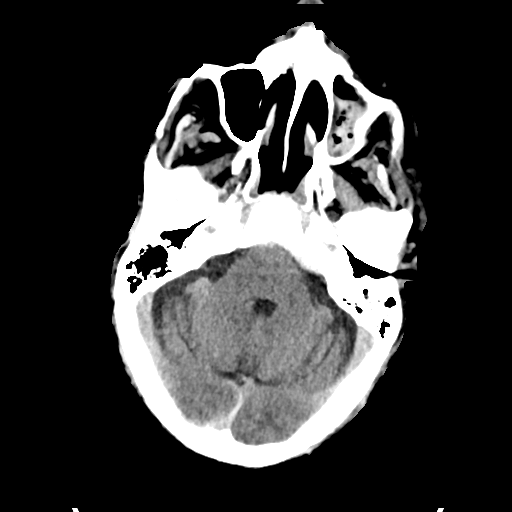
[im 15/39  brain]
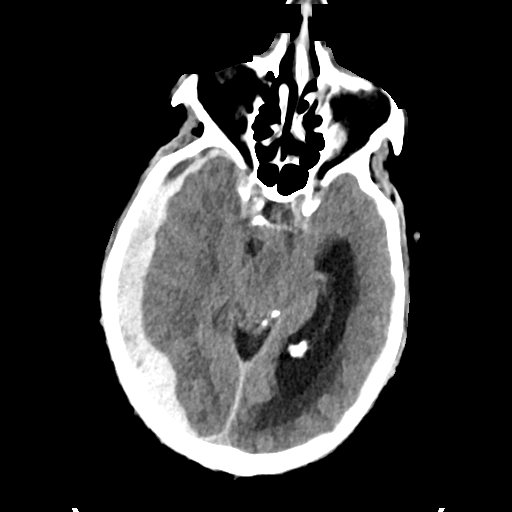
[im 20/39  brain]
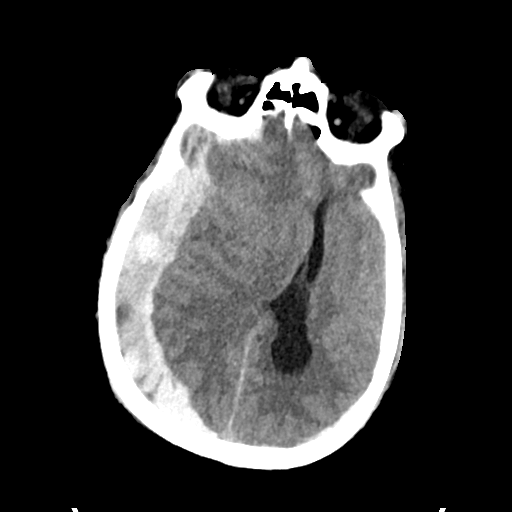
[im 24/39  brain]
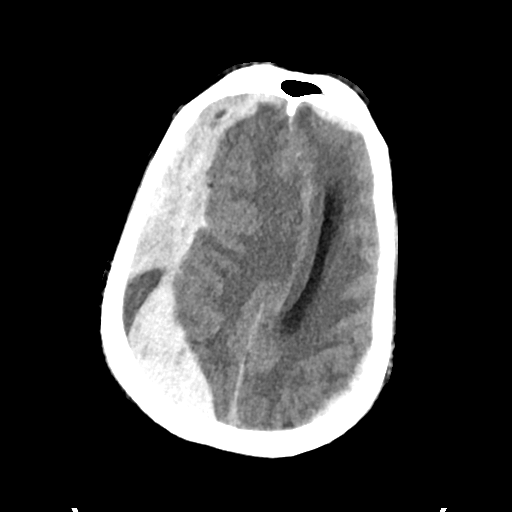
[im 24/39  bone]
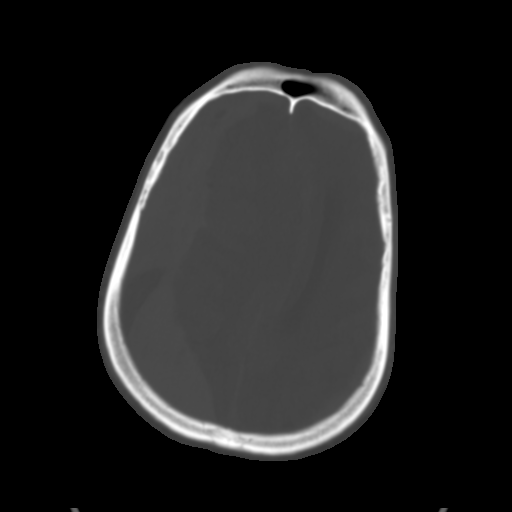
[im 29/39  brain]
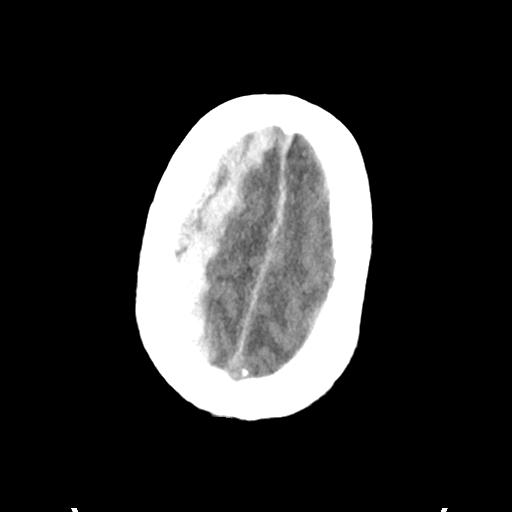
[im 34/39  brain]
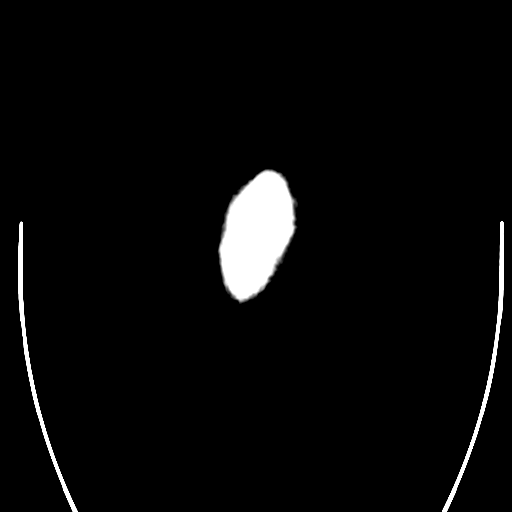

[Series 5: cor soft · coronal · 0.37mm/px · 3 of 77 slices shown]
[im 26/77  brain]
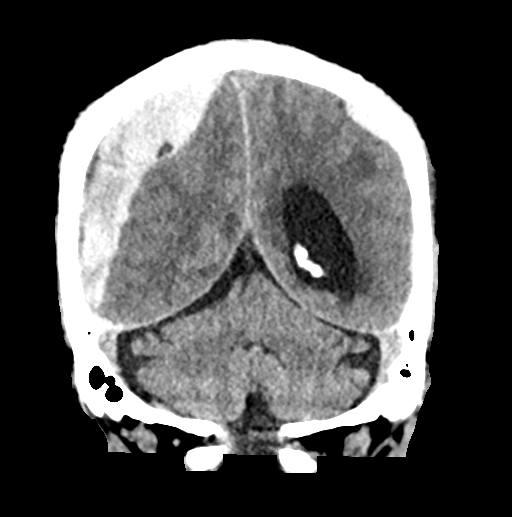
[im 34/77  brain]
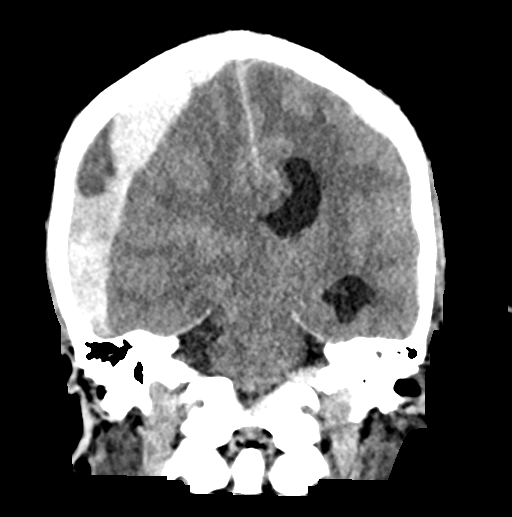
[im 43/77  brain]
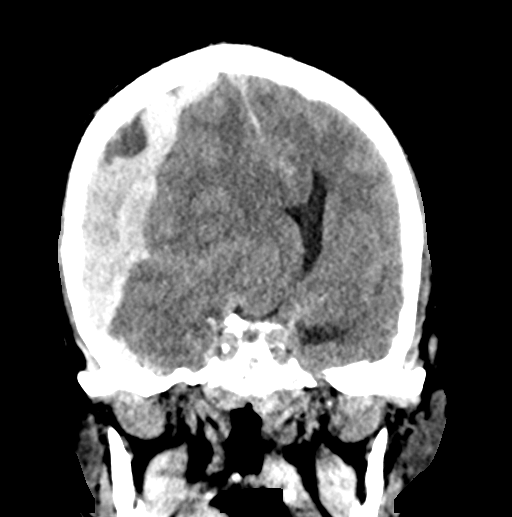

[Series 6: sag soft · sagittal · 0.36mm/px · 3 of 63 slices shown]
[im 21/63  brain]
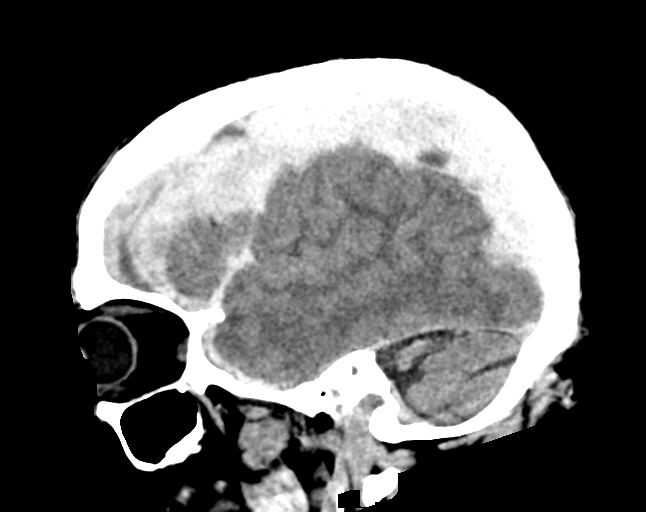
[im 32/63  brain]
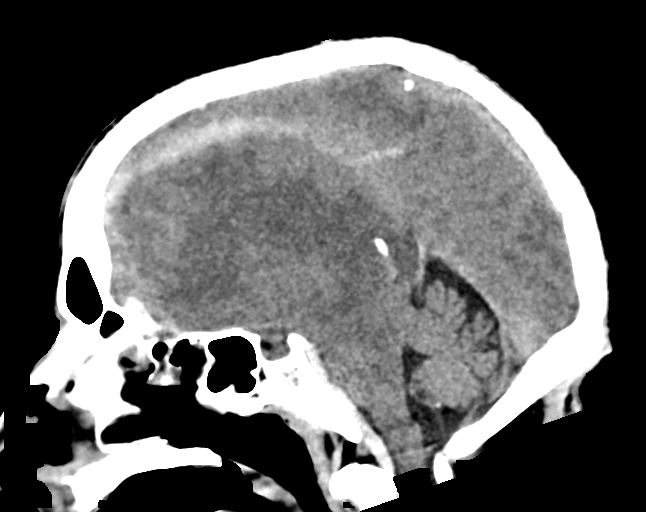
[im 42/63  brain]
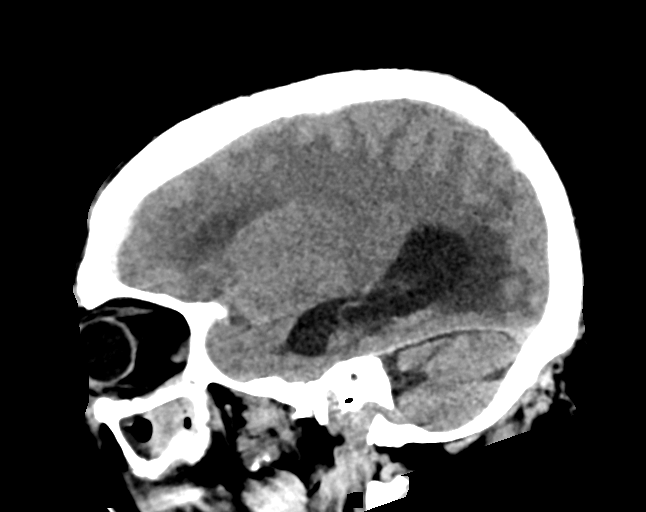

[13 of 47 positions shown; findings below may reference images not displayed]

FINDINGS: Brain:

There is a large mixed density holohemispheric hyperacute/acute
right subdural hematoma measuring 2.2 cm in thickness. There is also
thin acute subdural hemorrhage along the falx, and right tentorium.
Severe mass effect upon the underlying right cerebral hemisphere.
Complete effacement of the lateral and third ventricles. 2 cm
leftward midline shift. There is right-sided uncal herniation as
well as leftward subfalcine herniation. Mass effect upon the
brainstem. There is a 3 mm focus of head within the left aspect of
the pons which may reflect a Duret hemorrhage. Dilation of the left
lateral ventricle likely reflecting entrapment hydrocephalus.

There is an additional acute left-sided subdural hemorrhage
overlying the left frontoparietal lobes measuring up to 5 mm.

There is also scattered small volume acute subarachnoid hemorrhage
most notably within the interhemispheric fissure.

Vascular: No definite hyperdense vessel.

Skull: There is a nondisplaced fracture within the left orbital roof
(series 4, image 46). There is also comminuted displaced fracture
lateral wall of the left orbit. There is a mildly displaced fracture
within the anterior wall of the left maxillary sinus which extends
to involve the left orbital floor. There is additional comminuted
and displaced fracturing of the anterior and posterolateral walls of
the left maxillary sinus. Redemonstrated comminuted bilateral nasal
bone fractures. Mildly displaced fracture of the coronoid process of
the left mandible (series 5, image 30). Comminuted and displaced
fracture of the left zygomatic arch.

Sinuses/Orbits: No definite retrobulbar hematoma. The globes are
normal in size and contour. Hyperdense opacification of the left
maxillary sinus likely reflecting blood. Left frontal and bilateral
ethmoid sinus mucosal thickening. There is a left mastoid effusion.
No definite left temporal bone fracture is identified.

These results were called by telephone at the time of interpretation
on 09/06/2019 at [DATE] to provider CA MOSTAK , who verbally
acknowledged these results.
IMPRESSION: 1. Large mixed density holohemispheric hyperacute/acute right
subdural hematoma measuring 2 cm in thickness. Thin acute subdural
blood products are also present along the falx and right tentorium.
2. Severe mass effect upon the underlying right cerebral hemisphere.
2 cm leftward midline shift with complete effacement of the right
lateral and third ventricles. Right uncal herniation with mass
effect upon the brainstem as well as leftward subfalcine herniation.
Left lateral ventriculomegaly consistent with entrapment
hydrocephalus. A 3 mm parenchymal hemorrhage within the upper pons
may reflect a Duret hemorrage.
3. Acute subdural hematoma overlying the left frontoparietal lobes
measuring 5 mm in greatest thickness.
4. Scattered acute subarachnoid hemorrhage greatest within the
interhemispheric fissure.
5. Multiple maxillofacial and left orbital fractures as detailed. Of
note, one of the left orbital fractures traverses the left orbital
roof. Maxillofacial CT is recommended for further evaluation when
clinically feasible.
6. Mildly displaced fracture of the coronoid process of the left
mandible.
7. Hyperdense opacification of the left maxillary sinus likely
reflecting blood.
8. Left mastoid effusion without definite left temporal bone
fracture identified.
